# Patient Record
Sex: Male | Born: 1950 | Race: White | Smoking: Light tobacco smoker
Health system: Northeastern US, Community
[De-identification: ages and names within clinical notes are randomized; demographics above are authoritative.]

## PROBLEM LIST (undated history)

## (undated) DIAGNOSIS — K819 Cholecystitis, unspecified: Secondary | ICD-10-CM

## (undated) DIAGNOSIS — C189 Malignant neoplasm of colon, unspecified: Secondary | ICD-10-CM

## (undated) DIAGNOSIS — K37 Unspecified appendicitis: Secondary | ICD-10-CM

## (undated) HISTORY — PX: COLOSTOMY: SHX63

---

## 2006-10-02 ENCOUNTER — Emergency Department (HOSPITAL_BASED_OUTPATIENT_CLINIC_OR_DEPARTMENT_OTHER): Payer: Self-pay

## 2006-10-04 LAB — EMERGENCY ROOM NOTE

## 2006-10-06 LAB — WOUND CULTURE AND GRAM STAIN
GRAM STAIN: NONE SEEN
WOUND CULTURE: NO GROWTH

## 2017-05-19 ENCOUNTER — Encounter (HOSPITAL_BASED_OUTPATIENT_CLINIC_OR_DEPARTMENT_OTHER): Payer: Self-pay

## 2017-05-19 ENCOUNTER — Emergency Department (HOSPITAL_BASED_OUTPATIENT_CLINIC_OR_DEPARTMENT_OTHER)
Admission: RE | Admit: 2017-05-19 | Disposition: A | Discharge status: Home / Self Care | Payer: Self-pay | Source: Emergency Department | Attending: Emergency Medicine | Admitting: Emergency Medicine

## 2017-05-19 HISTORY — DX: Cholecystitis, unspecified: K81.9

## 2017-05-19 HISTORY — DX: Malignant neoplasm of colon, unspecified: C18.9

## 2017-05-19 HISTORY — DX: Unspecified appendicitis: K37

## 2017-05-19 LAB — CBC, PLATELET & DIFFERENTIAL
ABSOLUTE BASO COUNT: 0 10*3/uL (ref 0.0–0.1)
ABSOLUTE EOSINOPHIL COUNT: 0.2 10*3/uL (ref 0.0–0.8)
ABSOLUTE IMM GRAN COUNT: 0.03 10*3/uL (ref 0.00–0.03)
ABSOLUTE LYMPH COUNT: 1.1 10*3/uL (ref 0.6–5.9)
ABSOLUTE MONO COUNT: 0.7 10*3/uL (ref 0.2–1.4)
ABSOLUTE NEUTROPHIL COUNT: 8.4 10*3/uL — ABNORMAL HIGH (ref 1.6–8.3)
BASOPHIL %: 0.3 % (ref 0.0–1.2)
EOSINOPHIL %: 1.7 % (ref 0.0–7.0)
HEMATOCRIT: 50.9 % (ref 40.1–51.0)
HEMOGLOBIN: 16.5 g/dL (ref 13.7–17.5)
IMMATURE GRANULOCYTE %: 0.3 % (ref 0.0–0.4)
LYMPHOCYTE %: 10.8 % — ABNORMAL LOW (ref 15.0–54.0)
MEAN CORP HGB CONC: 32.4 g/dL (ref 31.0–37.0)
MEAN CORPUSCULAR HGB: 30.7 pg (ref 26.0–34.0)
MEAN CORPUSCULAR VOL: 94.6 fL (ref 80.0–100.0)
MEAN PLATELET VOLUME: 9.4 fL (ref 8.7–12.5)
MONOCYTE %: 7 % (ref 4.0–13.0)
NEUTROPHIL %: 79.9 % — ABNORMAL HIGH (ref 40.0–75.0)
PLATELET COUNT: 235 10*3/uL (ref 150–400)
RBC DISTRIBUTION WIDTH STD DEV: 43.9 fL (ref 35.1–46.3)
RBC DISTRIBUTION WIDTH: 12.9 % (ref 11.5–14.3)
RED BLOOD CELL COUNT: 5.38 M/uL (ref 4.60–6.10)
WHITE BLOOD CELL COUNT: 10.5 10*3/uL (ref 4.0–11.0)

## 2017-05-19 LAB — CT ABDOMEN & PELVIS W IV CONTRAST

## 2017-05-19 LAB — HEPATIC FUNCTION PANEL
ALANINE AMINOTRANSFERASE: 28 U/L (ref 12–45)
ALBUMIN: 4 g/dL (ref 3.4–5.0)
ALKALINE PHOSPHATASE: 71 U/L (ref 45–117)
ASPARTATE AMINOTRANSFERASE: 20 U/L (ref 8–34)
BILIRUBIN DIRECT: 0.1 mg/dl (ref 0.0–0.2)
BILIRUBIN TOTAL: 0.4 mg/dL (ref 0.2–1.0)
INDIRECT BILIRUBIN: 0.3 mg/dL (ref 0.2–0.9)
TOTAL PROTEIN: 7.6 g/dL (ref 6.4–8.2)

## 2017-05-19 LAB — BASIC METABOLIC PANEL
ANION GAP: 7 mmol/L (ref 5–15)
BUN (UREA NITROGEN): 15 mg/dL (ref 7–18)
CALCIUM: 9.4 mg/dL (ref 8.5–10.1)
CARBON DIOXIDE: 32 mmol/L (ref 21–32)
CHLORIDE: 104 mmol/L (ref 98–107)
CREATININE: 1.1 mg/dL (ref 0.7–1.2)
ESTIMATED GLOMERULAR FILT RATE: >60 mL/min (ref >60)
Glucose Random: 97 mg/dL (ref 74–160)
POTASSIUM: 4.8 mmol/L (ref 3.5–5.1)
SODIUM: 143 mmol/L (ref 136–145)

## 2017-05-19 LAB — HOLD GREEN TOP TUBE

## 2017-05-19 LAB — MAGNESIUM: MAGNESIUM: 2.2 mg/dL (ref 1.8–2.4)

## 2017-05-19 LAB — LIPASE: LIPASE: 110 U/L (ref 73–393)

## 2017-05-19 LAB — LACTIC ACID: LACTIC ACID: 1.7 mmol/L (ref 0.4–2.0)

## 2017-05-19 MED ORDER — MORPHINE SULFATE 4 MG/ML IV SOLN (SUPER ERX)
4.00 mg | Freq: Once | Status: AC
Start: 2017-05-19 — End: 2017-05-19
  Administered 2017-05-19: 4 mg via INTRAVENOUS
  Filled 2017-05-19: qty 1

## 2017-05-19 MED ORDER — POLYETHYLENE GLYCOL 3350 17 G PO PACK
17.00 g | PACK | Freq: Every day | ORAL | 0 refills | Status: AC
Start: 2017-05-19 — End: 2017-05-26

## 2017-05-19 MED ORDER — DIAZEPAM 5 MG PO TABS
5.00 mg | ORAL_TABLET | Freq: Three times a day (TID) | ORAL | 0 refills | Status: AC
Start: 2017-05-19 — End: 2017-05-23

## 2017-05-19 MED ORDER — CITRATE OF MAGNESIA PO SOLN
296.00 mL | Freq: Once | ORAL | Status: AC
Start: 2017-05-19 — End: 2017-05-19
  Administered 2017-05-19: 296 mL via ORAL
  Filled 2017-05-19: qty 300

## 2017-05-19 MED ORDER — POLYETHYLENE GLYCOL 3350 PO PACK: 17 g | Package | Freq: Every day | ORAL | 0 refills | 0 days | Status: AC

## 2017-05-19 MED ORDER — DIAZEPAM 5 MG PO TABS
5.00 mg | ORAL_TABLET | Freq: Once | ORAL | Status: AC
Start: 2017-05-19 — End: 2017-05-19
  Administered 2017-05-19: 5 mg via ORAL
  Filled 2017-05-19: qty 1

## 2017-05-19 MED ORDER — DIAZEPAM 5 MG PO TABS: 5 mg | tablet | Freq: Three times a day (TID) | ORAL | 0 refills | 0 days | Status: AC

## 2017-05-19 MED ORDER — NORMAL SALINE FLUSH 0.9 % IV SOLN
60.0000 mL | Freq: Once | INTRAVENOUS | Status: DC
Start: 2017-05-19 — End: 2017-05-19

## 2017-05-19 MED ORDER — IOHEXOL 240 MG/ML IJ SOLN
525.0000 mL | INTRAMUSCULAR | Status: DC | PRN
Start: 2017-05-19 — End: 2017-05-19
  Administered 2017-05-19: 525 mL via ORAL

## 2017-05-19 MED ORDER — IOHEXOL 300 MG/ML IJ SOLN
100.00 mL | Freq: Once | INTRAMUSCULAR | Status: AC
Start: 2017-05-19 — End: 2017-05-19
  Administered 2017-05-19: 100 mL via INTRAVENOUS

## 2017-05-19 MED ORDER — SODIUM CHLORIDE 0.9 % IV BOLUS
1000.0000 mL | Freq: Once | INTRAVENOUS | Status: AC
Start: 2017-05-19 — End: 2017-05-19
  Administered 2017-05-19: 1000 mL via INTRAVENOUS

## 2017-05-19 NOTE — Narrator Note (Signed)
Patient reports pain has improved slightly. Rated 8 out of 10. Transported to CT at this time

## 2017-05-19 NOTE — Narrator Note (Signed)
Patient Disposition    Patient education for diagnosis, medications, activity, diet and follow-up.  Patient left ED 1:34 PM.  Patient rep received written instructions.  Interpreter to provide instructions: No    Patient belongings with patient: YES    Have all existing LDAs been addressed? Yes    Have all IV infusions been stopped? Yes    Discharged to: Discharged to home. Pt given RX for miralax and valium. D/C and follow up instructions reviewed with pt. Pt verbalized understanding of instructions and ambulated to waiting room. Pt educated of side effects of valium and morphine. Pt states his wife will be picking him up.

## 2017-05-19 NOTE — Narrator Note (Signed)
MD at bedside. Stool sample solid unable to run C.diff exam however stool was solid and did not have odor . Likely not c.diff MD aware

## 2017-05-19 NOTE — Narrator Note (Signed)
Pt experiencing some abd cramping. Thinks trying to defecating will relieve the pain. Ambulatory to restroom. Declined assistance

## 2017-05-19 NOTE — ED Triage Note (Signed)
Self presents ambulatory with wife. Reports abd pain x 5 weeks with loose stools and cramping. Reports significant abdominal illness including colon cancer, temporary colostomy, radiation, chemo, appendicitis, cholecystitis. Reports cancer was treated from 200 to 2003 and was followed at [facility]. However pt moved [place], just returned to [place] 2 months ago. Reports taking bentyl for the cramping, this helps. And took 2 tabs of percocet last night, this helped too. Reports "I quit smoking two weeks ago and thought I was having withdrawals"

## 2017-05-19 NOTE — Narrator Note (Signed)
MD at bedside for reeval

## 2017-05-19 NOTE — Narrator Note (Signed)
Pt requesting additional pain medicine.

## 2017-05-19 NOTE — Narrator Note (Signed)
MD at bedside conducting assessment

## 2017-05-20 NOTE — ED Provider Notes (Addendum)
The patient was seen primarily by me. ED nursing record was reviewed. Prior records as available electronically through the Epic record were reviewed.         HPI:    This is a 66 year old male patient complaining of acute on chronic abdominal pain.  Patient reports 5 days of worsening abdominal pain, crampy in nature, associated with several small bowel movements, none liquid.  Wife and himself were caring for his wife's father recently was diagnosed with C. difficile, concerned he may have been infected.  Patient denies any fevers, nausea, vomiting, diarrhea, dizziness, syncope, dysuria, hematuria, testicle pain or swelling, penile pain, penile discharge, rash.  Patient with complex abdominal history including colon cancer status post resection, colostomy with reanastomosis.  Patient reports he took a Percocet yesterday to help with pain.  Otherwise has been using Bentyl with mild relief.  Patient does admit to chronic use of Imodium to counteract chronic intermittent diarrhea.  Denies large volume of stools, denies significant liquid stool.  Patient reports colon cancer in remission since 2003.      ROS: Pertinent positives were reviewed as per the HPI above. All other systems were reviewed and are negative.      Past Medical History/Problem list:  Past Medical History:   Diagnosis Date    Appendicitis     Cholecystitis     Colon cancer ([place])      There is no problem list on file for this patient.        Past Surgical History: Past Surgical History:  No date: COLOSTOMY      Medications:   No current facility-administered medications for this encounter.   Current Outpatient Medications:  oxyCODONE-acetaminophen (PERCOCET) 5-325 MG per tablet Take 1 tablet by mouth every 4 (four) hours as needed for Pain Disp:  Rfl:    dicyclomine (BENTYL) 10 MG capsule Take 10 mg by mouth 4 (four) times daily before meals and nightly Disp:  Rfl:    polyethylene glycol (GLYCOLAX/MIRALAX) packet Take 1 packet by mouth daily for 7  days Disp: 30 Package Rfl: 0   diazepam (VALIUM) 5 MG tablet Take 1 tablet by mouth every 8 (eight) hours for 4 days Disp: 12 tablet Rfl: 0         Social History: Social History    Tobacco Use      Smoking status: Light Tobacco Smoker      Tobacco comment: quit cigarettes 2 weeks ago    Alcohol use: Yes      Comment: socially        Allergies:  Review of Patient's Allergies indicates:   Ampicillin              Other (See Comments)    Comment:As a child   Erythromycin            Other (See Comments)    Comment:As a child      Physical Exam:  ED Triage Vitals   ED Triage Vitals Brief Group      Temp 05/19/17 0842 98.1 F      Pulse 05/19/17 0842 71      Resp 05/19/17 0842 20      BP 05/19/17 0842 126/75      SpO2 05/19/17 0842 98 %      Pain Score 05/19/17 1121 8        GENERAL: No acute distress.   SKIN:  Warm & Dry, no rash.  HEAD: Atraumatic. PERRL. EOMI.  Oropharynx: clear.  NECK: No midline tenderness.  No LAN.   LUNGS:  Clear to auscultation bilaterally. No wheezes, rales, rhonchi.   HEART:  RRR.  No murmurs, rubs, or gallops.   ABDOMEN:  Soft, moderate left lower quadrant tenderness to palpation.  No guarding or rebound tenderness.   MUSCULOSKELETAL:  No obvious deformities.    NEUROLOGIC: Alert and oriented.  Moves all extremities well.  PSYCHIATRIC:  Appropriate for age, time of day, and situation        ED Course and Medical Decision-making:    The patient is a  66 year old male with past medical history as above presents with abdominal pain, described as spasms for 5 days.  Patient with small bowel movements several times a day, description does not appear consistent with C. difficile however, if patient provide sample, we will send.  Few episodes of spasm witnessed in ED, suspect related to intestinal peristalsis, probable constipation versus partial obstruction.  Given patient's complex abdominal history, CT performed to evaluate for obstruction, colitis, diverticulitis.  Patient given Valium for  spasmodic pain.  Patient with mild relief however, reporting pain is increasing.  Patient given morphine for further pain control while awaiting CT.  Labs unremarkable.  CT significant for large volume of stool.  Patient given magnesium citrate to facilitate larger bowel movement.  Patient encouraged avoidance of Imodium and opioids as these are likely adversely affecting gut motility and causing patient's current state.  Patient given Valium for further pain control until stool clears, MiraLAX until patient is having regular soft stools.  Dietary modification discussed, patient agreeable with plan.  Patient with plan follow-up with PCP and gastroenterologist.    Additional verbal discharge instructions were provided including patients diagnosis and follow up plan, as well as reasons to return to the Emergency Department which were discussed in detail.  Patient is agreeable with this management.         Condition: Improved and Stable    Disposition:  Discharged to home    Diagnosis/Diagnoses:  Generalized abdominal pain  Chronic idiopathic constipation        Ivy Lynn  Attending Physician  [facility]  [phone]

## 2021-03-28 IMAGING — MR MRI LUMBAR SPINE WITHOUT CONTRAST
4 of 7 series · 19 of 48 positions shown · IV contrast (gadolinium)
Comparison: None

________________________________________________________________________________________________ 
MRI LUMBAR SPINE WITHOUT CONTRAST, 03/28/2021 [DATE]: 
CLINICAL INDICATION: History of rectal cancer, low back pain
TECHNIQUE: Multiplanar, multiecho position MR images of the lumbar spine were 
performed without intravenous gadolinium enhancement. Patient was scanned on a 
1.5T magnet.

[Series 101: survey · axial · 10.0mm · 1.39mm/px · z∈[-33,+201]mm · 2 of 10 slices shown]
[im 1/10]
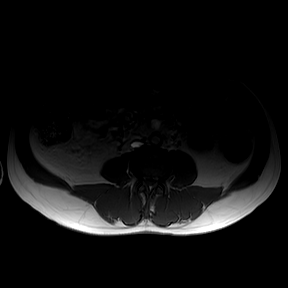
[im 10/10]
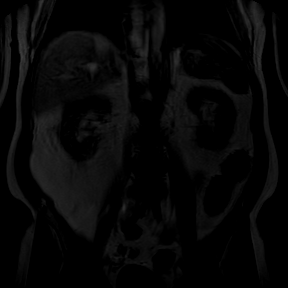

[Series 201: t2w_cor-surv · coronal · 6.0mm · 0.60mm/px · 3 of 8 slices shown]
[im 1/8]
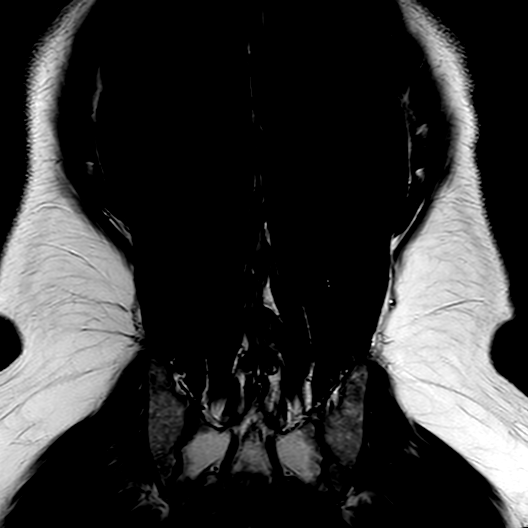
[im 4/8]
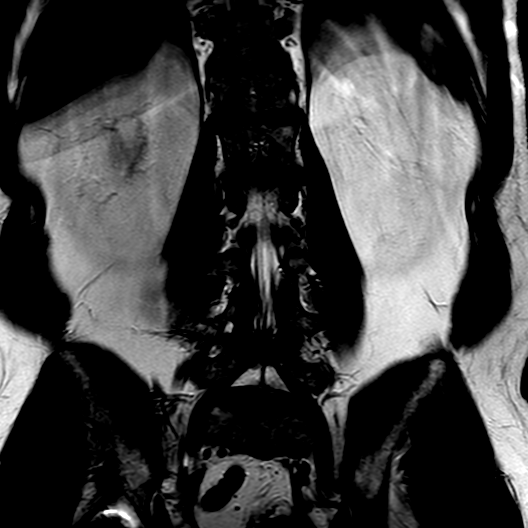
[im 8/8]
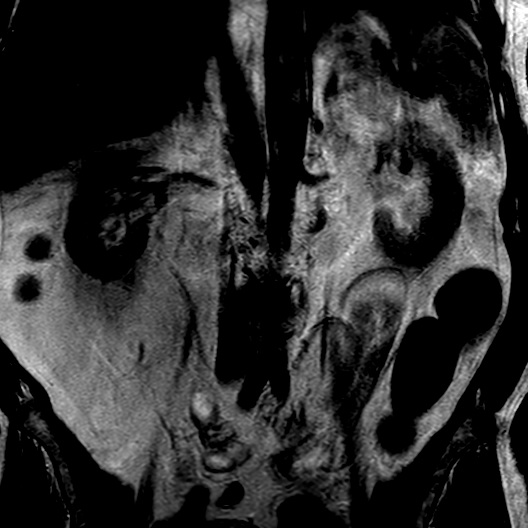

[Series 301: t1_tse_sag · sagittal · 4.0mm · 0.48mm/px · 5 of 19 slices shown]
[im 1/19]
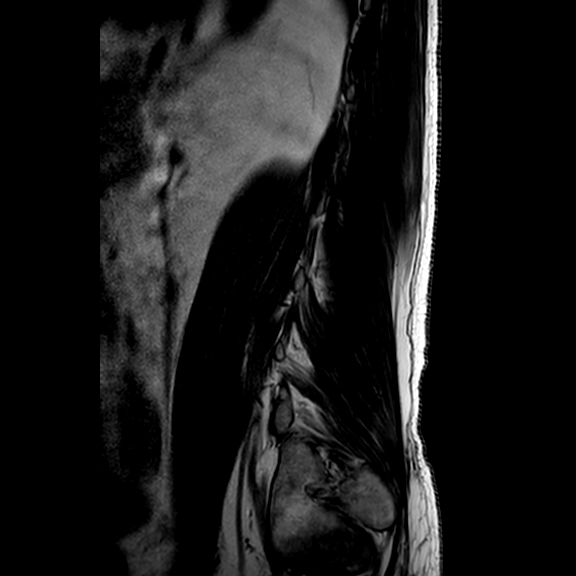
[im 4/19]
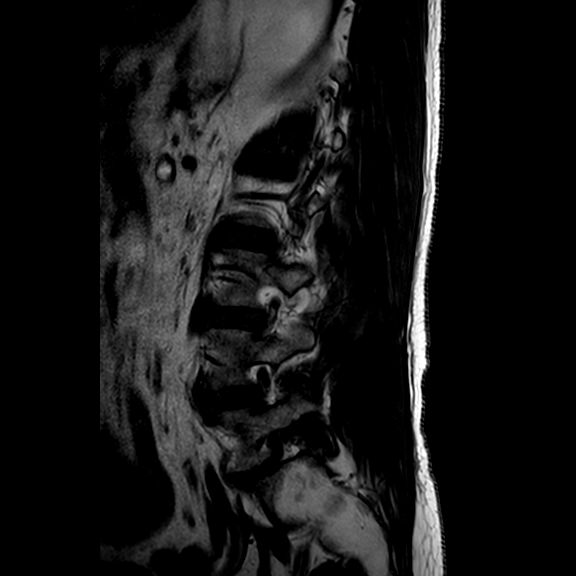
[im 7/19]
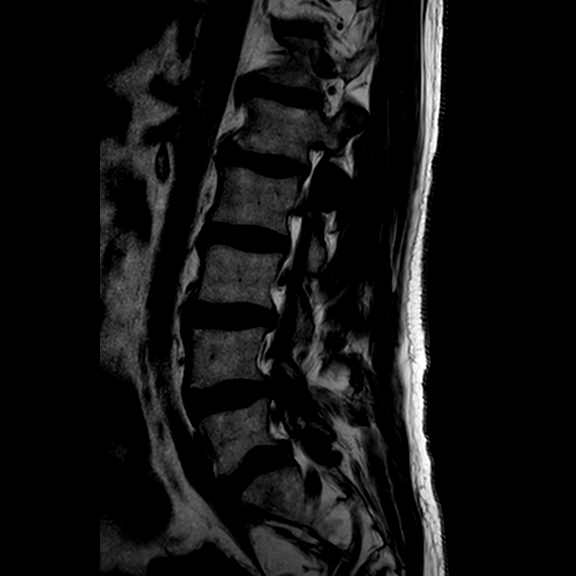
[im 10/19]
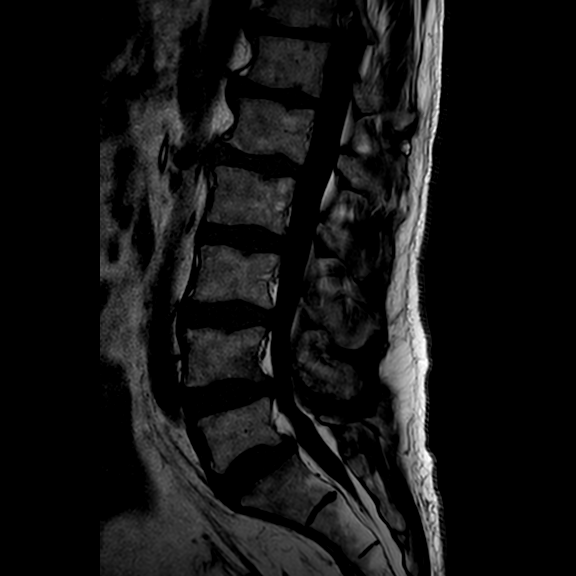
[im 16/19]
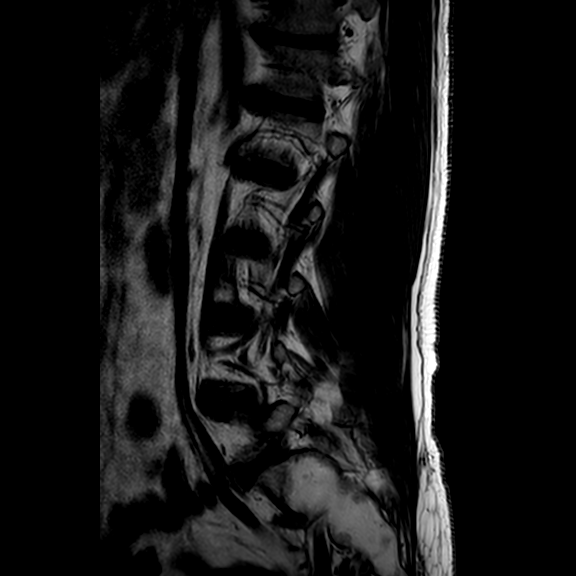

[Series 701: T1 · axial · 4.0mm · 0.38mm/px · z∈[-129,+75]mm · 9 of 35 slices shown]
[im 1/35]
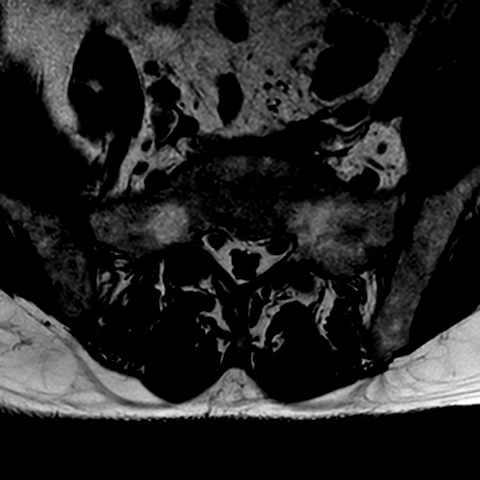
[im 7/35]
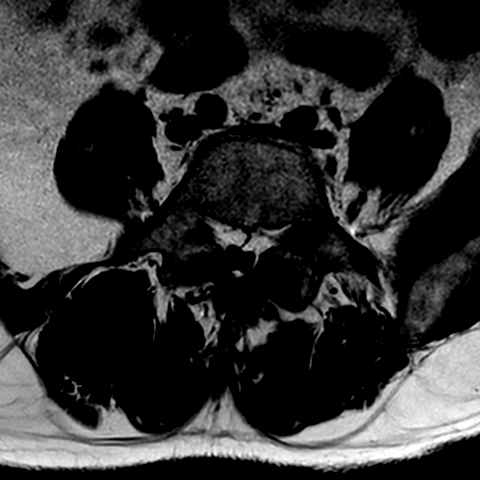
[im 10/35]
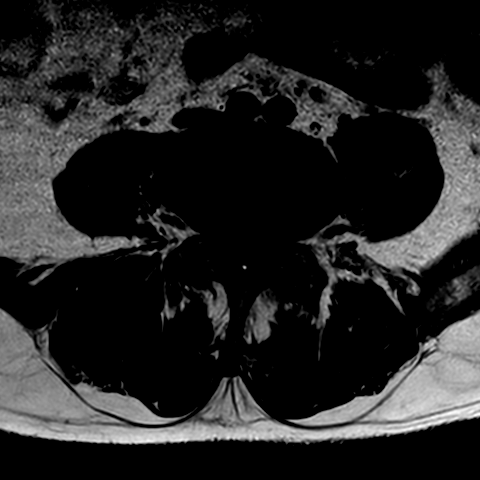
[im 16/35]
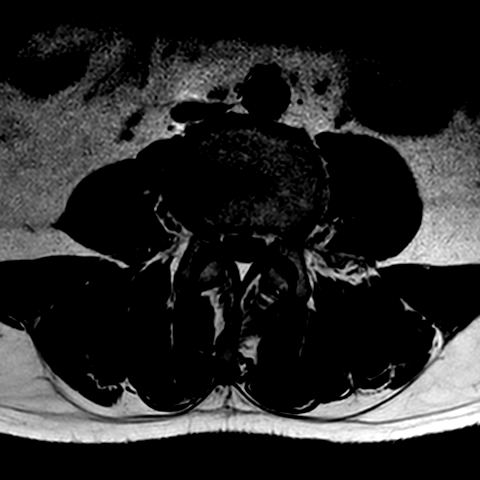
[im 19/35]
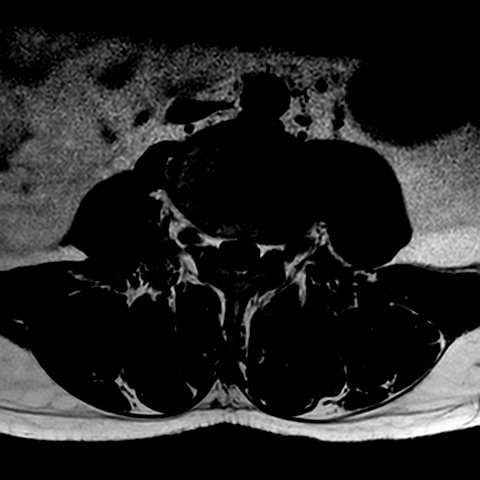
[im 25/35]
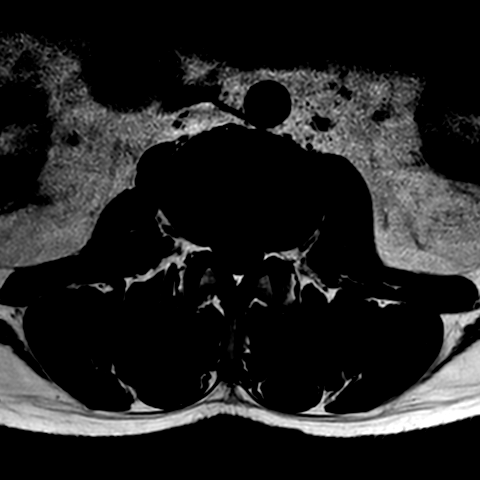
[im 28/35]
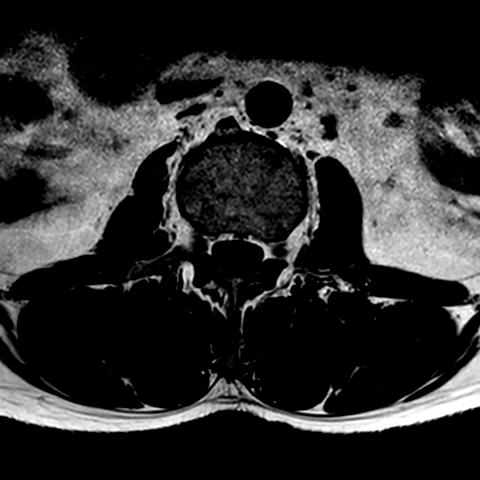
[im 31/35]
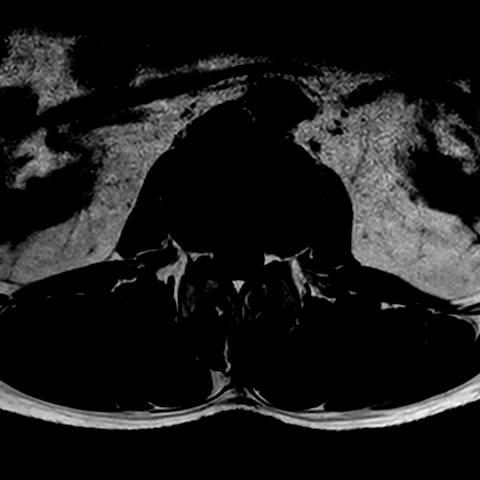
[im 35/35]
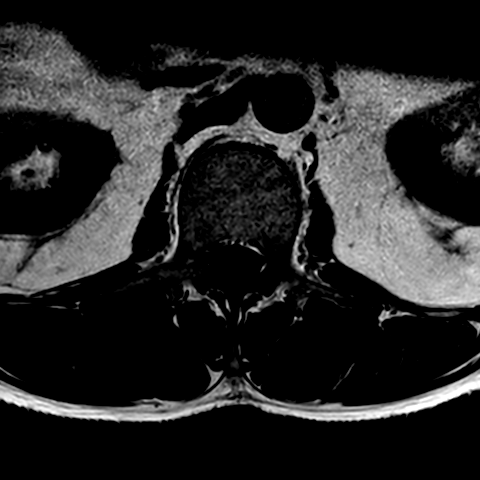

[19 of 48 positions shown; findings below may reference images not displayed]

FINDINGS: Lumbar vertebral heights are intact. There is no listhesis. No 
fracture, malignancy or Modic type I changes. 
At L5-S1 there is 2 mm anterolisthesis. Bilateral pars defects. There is no 
significant canal stenosis. There is mild bilateral foraminal stenosis. Mild to 
moderate facet degenerative change. 
At L4-5 there is broad-based disc bulge and moderate facet change with 
ligamentous thickening. Moderate to marked canal stenosis, axial image 8. Mild 
bilateral foraminal stenosis. 
At L3-4 there is mild disc bulge and mild to moderate facet change with 
ligamentous thickening contributing to mild to moderate canal stenosis. There is 
mild bilateral foraminal narrowing. 
At L2-3 and L1-2 the canal and foramina are open. 
Conus terminates opposite T12-L1. There are small chronic Schmorls nodes from 
T12 through L3. There is mild lumbar levoscoliosis. There is a right hip 
prosthesis.
IMPRESSION: Degenerative changes. There is moderate to marked canal stenosis at L4-5. 
2 mm anterolisthesis of L5 on S1 with bilateral pars defects. 
No evidence for compression fracture or malignancy. There are small chronic 
Schmorls nodes. 
Mild lumbar levoscoliosis. 
Right hip prosthesis. 
Abdominal aorta shows normal caliber. Visualized psoas and paraspinal 
musculature are symmetric, without evidence for abnormality.

## 2021-07-10 IMAGING — CT CT LUMBAR SPINE WITHOUT CONTRAST
2 series · 14 of 20 positions shown, 17 images · non-contrast
Comparison: Lumbar MRI March 28, 2021

________________________________________________________________________________________________ 
CT LUMBAR SPINE WITHOUT CONTRAST, 07/10/2021 [DATE]: 
CLINICAL INDICATION: History of rectal cancer with colon resection. Stenosis 
L4-5, spondylolisthesis L5-S1. 
A search for DICOM formatted images was conducted for prior CT imaging studies 
completed at a non-affiliated media free facility.
TECHNIQUE: The lumbar spine was scanned from T12 through mid-sacrum without 
contrast on a high-resolution CT scanner using dose reduction techniques. 
Routing MPR reconstructions were performed. 3-D renderings were reconstructed on 
an independent workstation with concurrent physician supervision.

[Series 2: axial · axial · 0.45mm/px · z∈[+695,+942]mm · 11 of 293 slices shown, 14 images]
[im 23/293  soft-tissue]
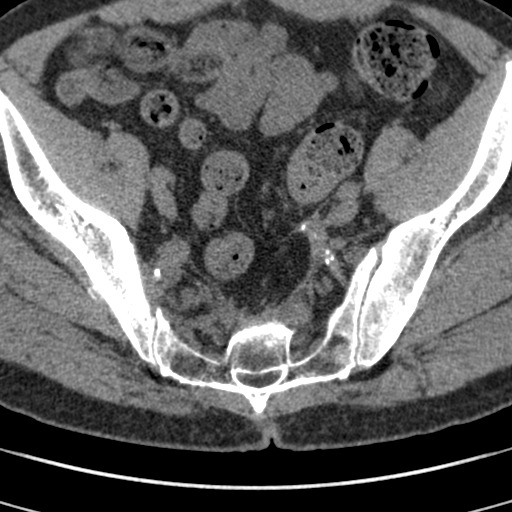
[im 23/293  bone]
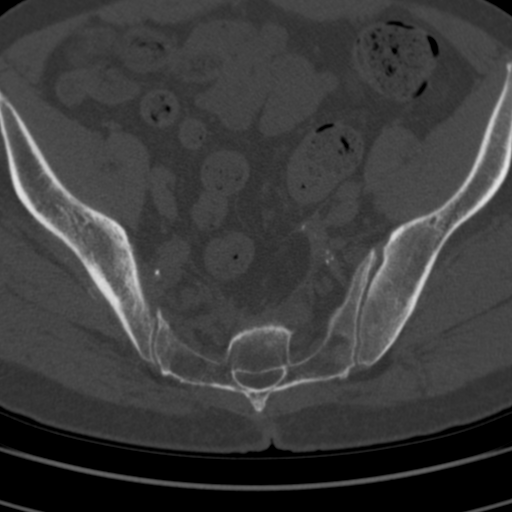
[im 45/293  bone]
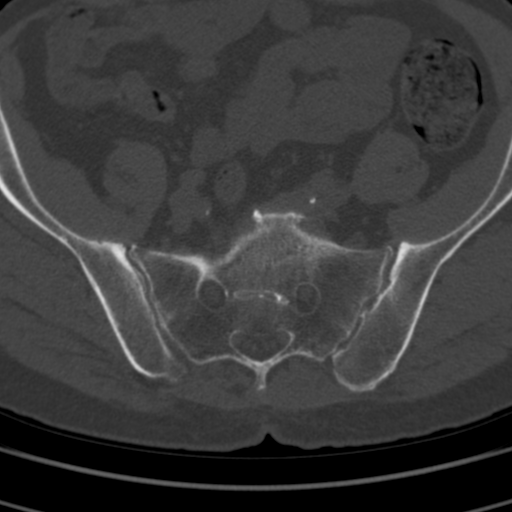
[im 68/293  bone]
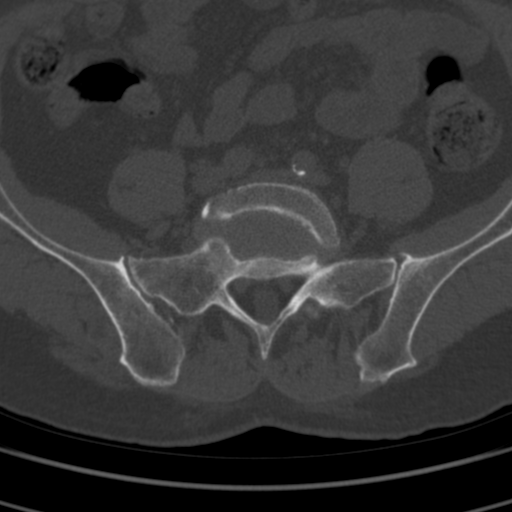
[im 90/293  bone]
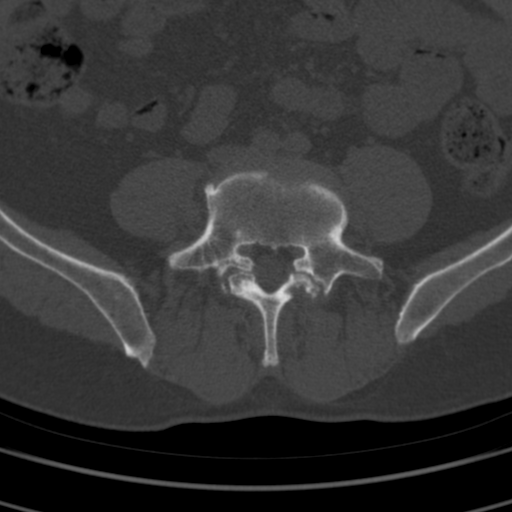
[im 113/293  soft-tissue]
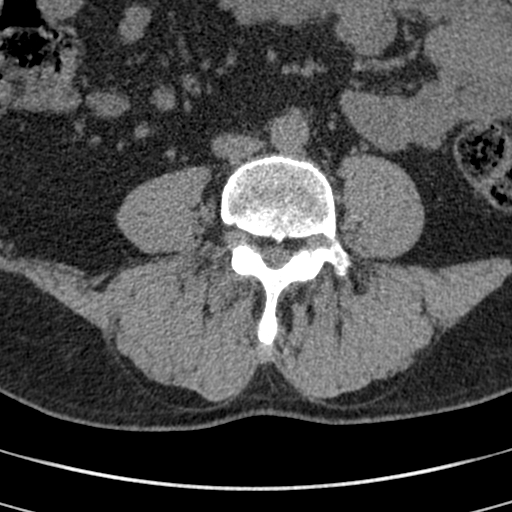
[im 113/293  bone]
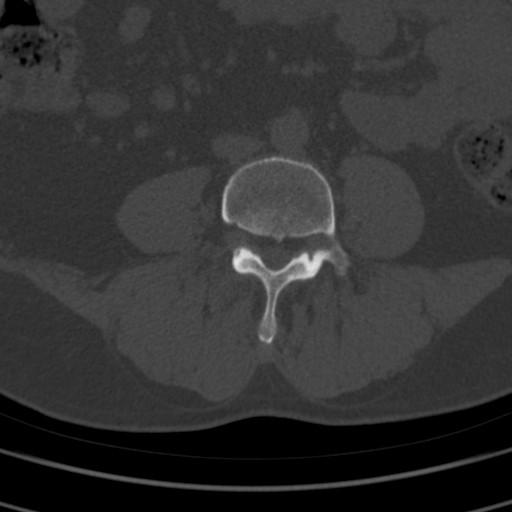
[im 158/293  bone]
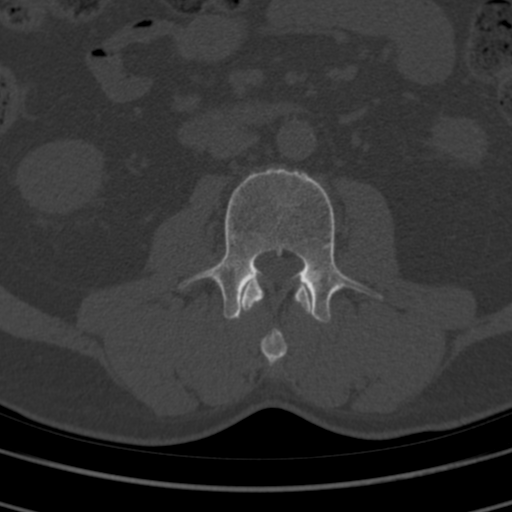
[im 180/293  bone]
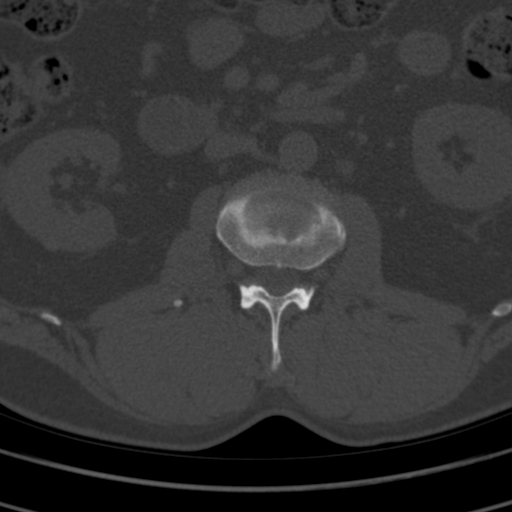
[im 203/293  bone]
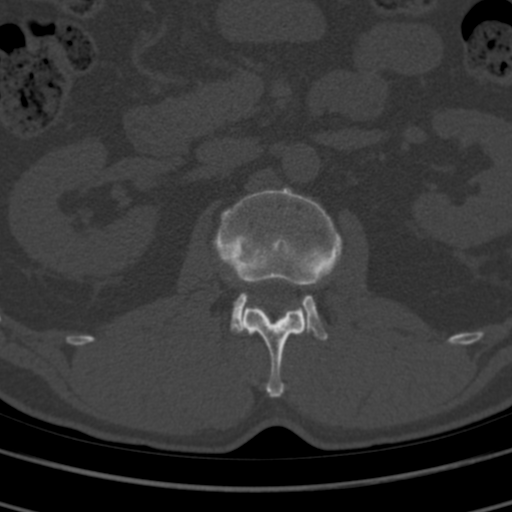
[im 225/293  soft-tissue]
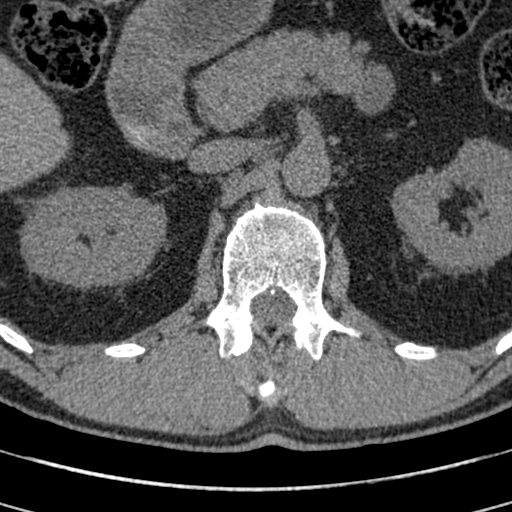
[im 225/293  bone]
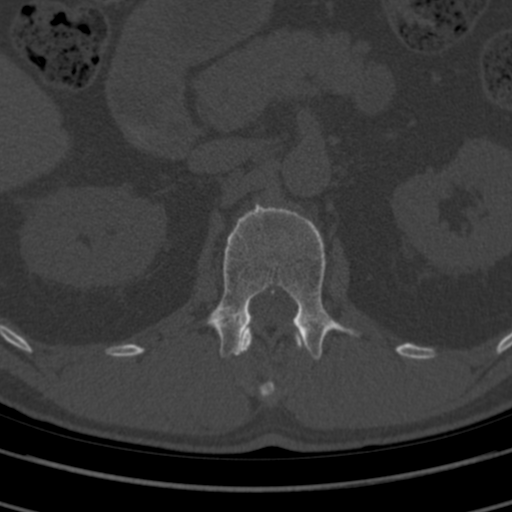
[im 248/293  bone]
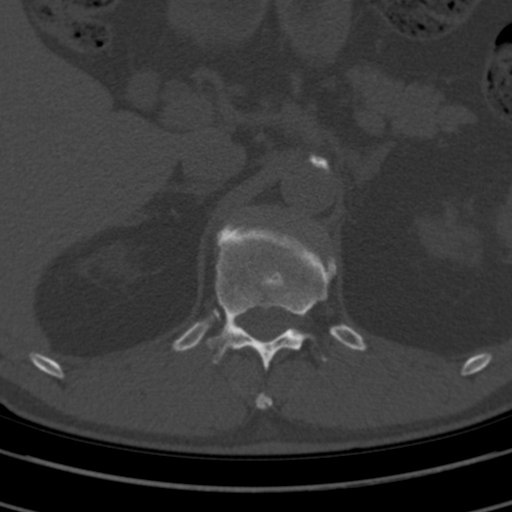
[im 270/293  bone]
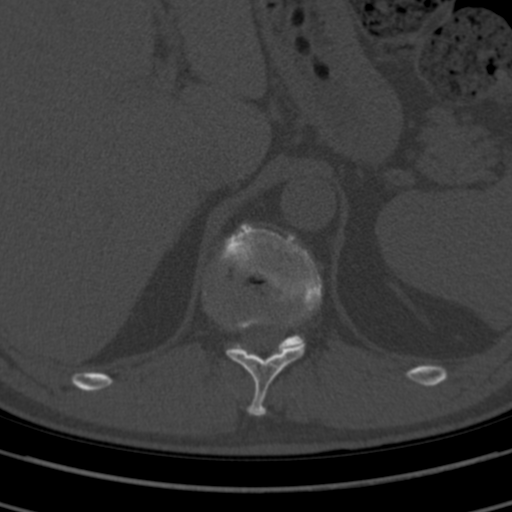

[Series 4: cor · coronal · 0.44mm/px · 3 of 149 slices shown]
[im 30/149  bone]
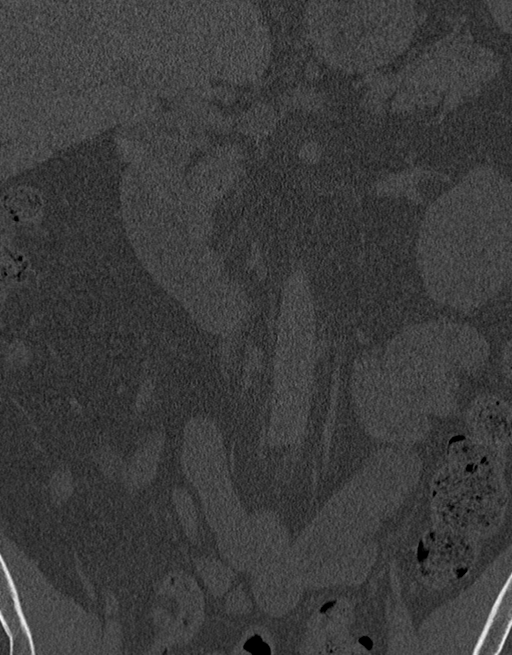
[im 60/149  bone]
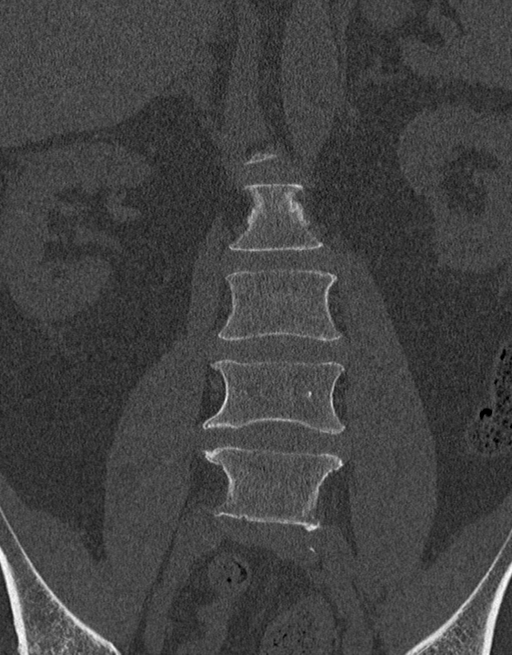
[im 89/149  bone]
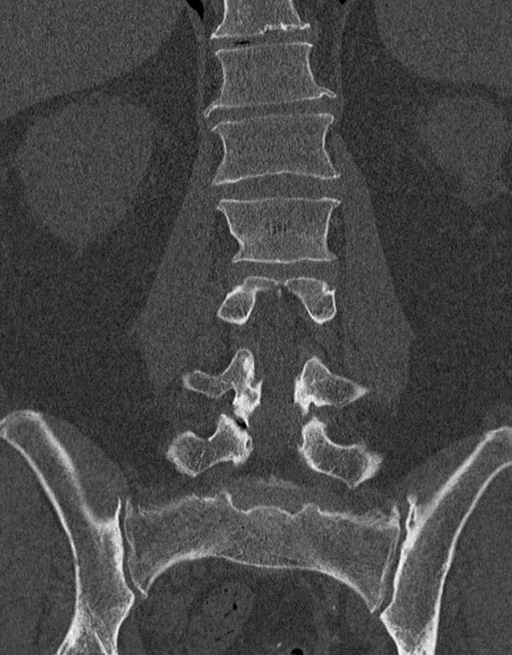

[14 of 20 positions shown; findings below may reference images not displayed]

FINDINGS: Lumbar vertebral heights are intact. Prior MRI showed slight 
anterolisthesis at L5-S1, similar to the CT appearance, measuring approximately 
2 mm. There are bilateral L5 pars defects. 
Lumbar disc heights are preserved. There is marked disc narrowing at T11-12. 
At L5-S1 the canal is open. There is moderate to marked bilateral foraminal 
stenosis deforming the distal L5 nerve roots, appearing worse on CT than on the 
prior MRI. 
At L4-5 there is moderate to marked canal stenosis, axial image 191, not 
significantly different compared with the prior MRI. There is moderate bilateral 
foraminal stenosis. 
At L3-4 there is moderate canal stenosis appearing worse, axial image 160. There 
is mild bilateral foraminal stenosis. 
At L2-3 there is mild canal stenosis. Foramina are open. At L1-2 the canal and 
foramina are open. 
There are chronic Schmorls nodes in the thoracolumbar junction. Minimal aortic 
plaque. No aneurysm. There are mild SI joint degenerative changes. There is mild 
lumbar levocurvature.
IMPRESSION: Moderate to marked canal stenosis at L4-5, not significantly different from the 
prior MRI. 
Moderate canal stenosis at L3-4, appearing mildly worse compared to the prior 
MRI.  
Moderate to marked bilateral foraminal stenosis at L5-S1 appears worse on CT 
than MRI. There is 2 mm anterolisthesis of L5 on S1 with bilateral pars defects, 
not different from the prior MRI. 
Mild lumbar levocurvature. 
RADIATION DOSE REDUCTION: All CT scans are performed using radiation dose 
reduction techniques, when applicable.  Technical factors are evaluated and 
adjusted to ensure appropriate moderation of exposure.  Automated dose 
management technology is applied to adjust the radiation doses to minimize 
exposure while achieving diagnostic quality images.

## 2023-05-31 IMAGING — MR MRI LUMBAR SPINE W/WO CONTRAST
6 of 12 series · 10 of 48 positions shown · IV contrast (gadavist)
Comparison: Lumbar spine x-ray May 21, 2023, CT lumbar spine July 10, 2021 and MRI lumbar spine March 28, 2021.

________________________________________________________________________________________________ 
MRI LUMBAR SPINE W/WO CONTRAST, 05/31/2023 [DATE]: 
CLINICAL INDICATION: Low back pain, unspecified , bilateral radiculopathy. 
History of laminectomy. Rectal cancer. Spondylolisthesis L4-L5 and L5-S1.
TECHNIQUE: Multiplanar, multiecho position MR images of the lumbar spine were 
performed without and with 8.5 mL of Gadavist were injected intravenously by 
hand. 1.5 mL of Gadavist discarded. Patient was scanned on a 1.5T magnet

[Series 101: survey · axial · 10.0mm · 1.25mm/px · 1 of 10 slices shown]
[im 1/10]
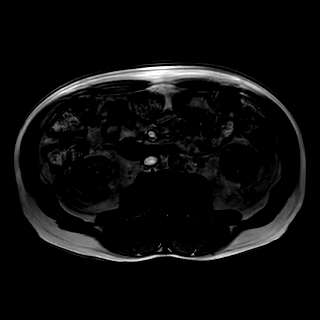

[Series 201: t2w_cor-surv · coronal · 6.0mm · 0.62mm/px · 1 of 10 slices shown]
[im 1/10]
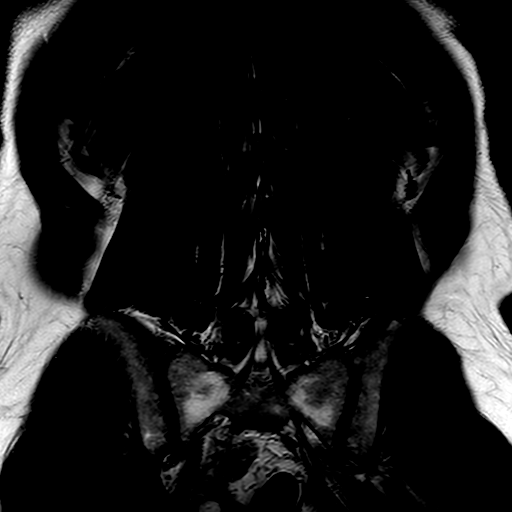

[Series 301: t1_tse_sag · sagittal · 4.0mm · 0.43mm/px · 2 of 20 slices shown]
[im 1/20]
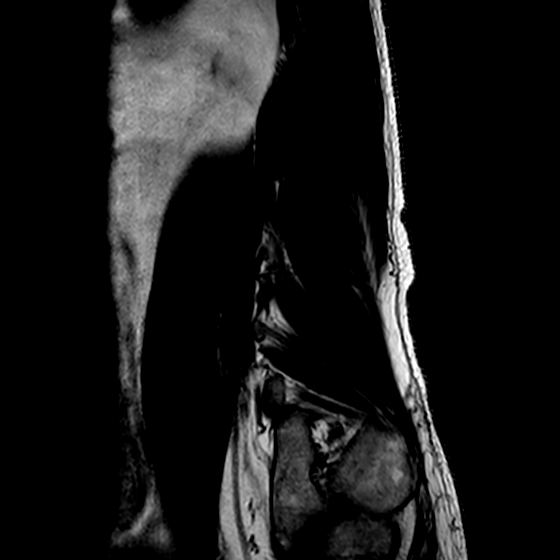
[im 20/20]
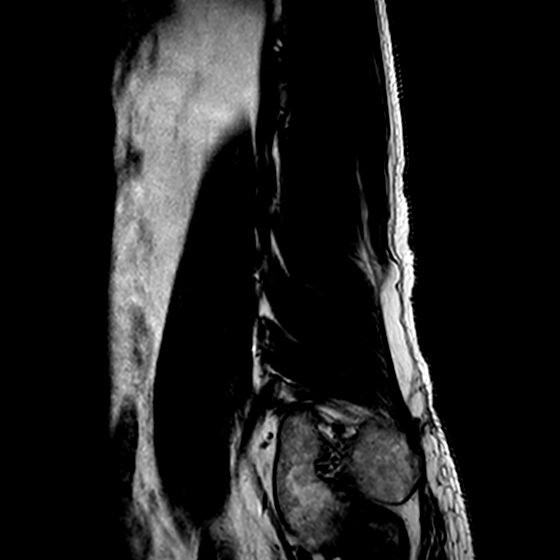

[Series 402: (id)_mdixon_tse · sagittal · 4.0mm · 0.47mm/px · 2 of 20 slices shown]
[im 1/20]
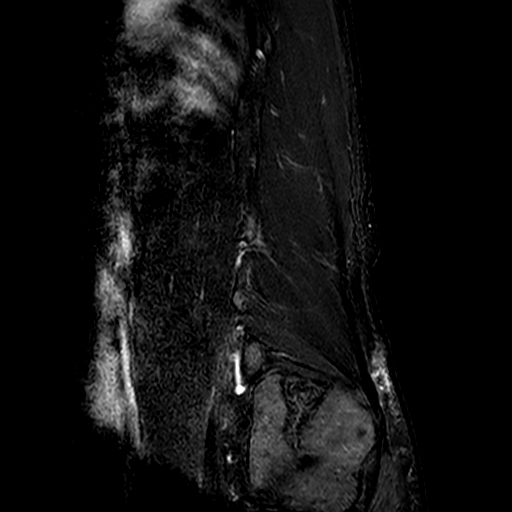
[im 20/20]
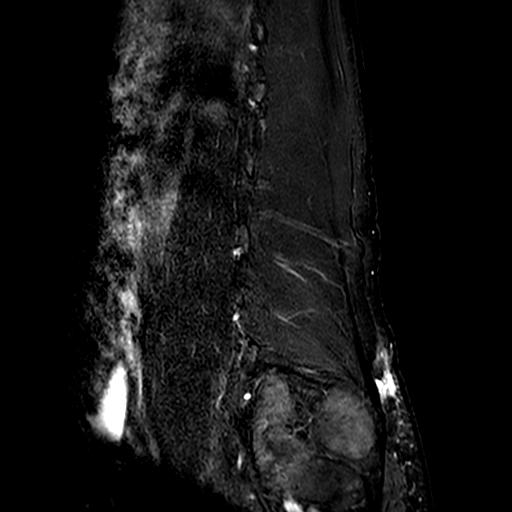

[Series 403: st2w_mdixon_tse · sagittal · 4.0mm · 0.47mm/px · 2 of 20 slices shown]
[im 1/20]
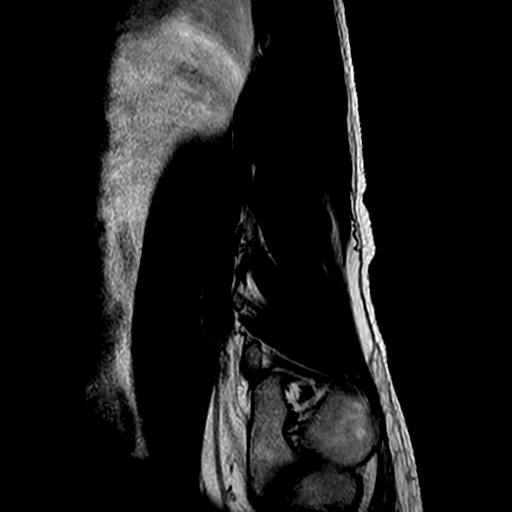
[im 20/20]
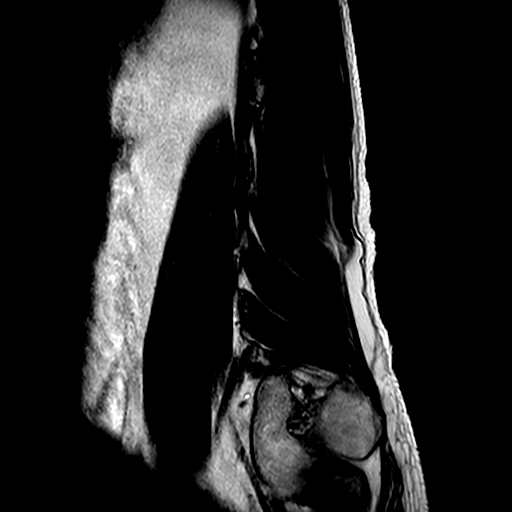

[Series 502: (id) view_ax mpr · axial · 1.0mm · 0.25mm/px · z∈[-149,-118]mm · 2 of 139 slices shown]
[im 1/139]
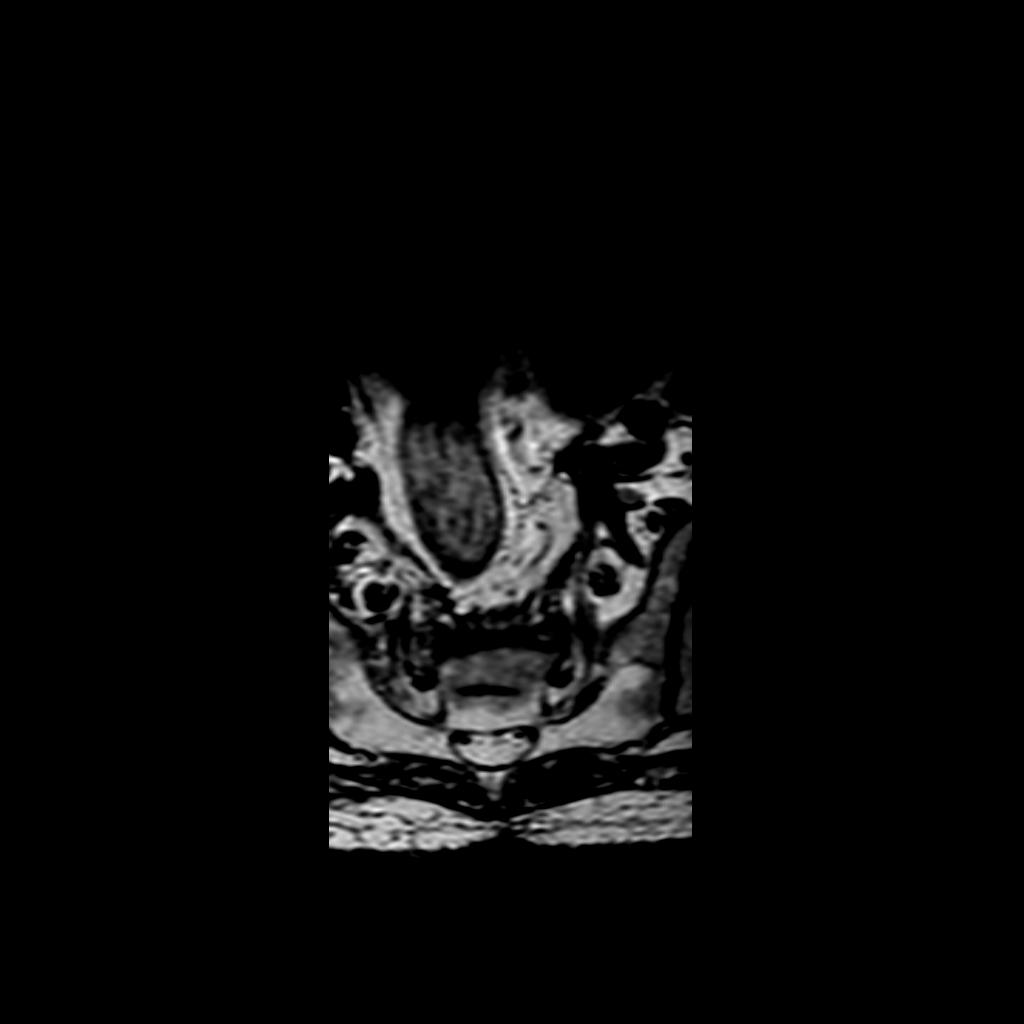
[im 22/139]
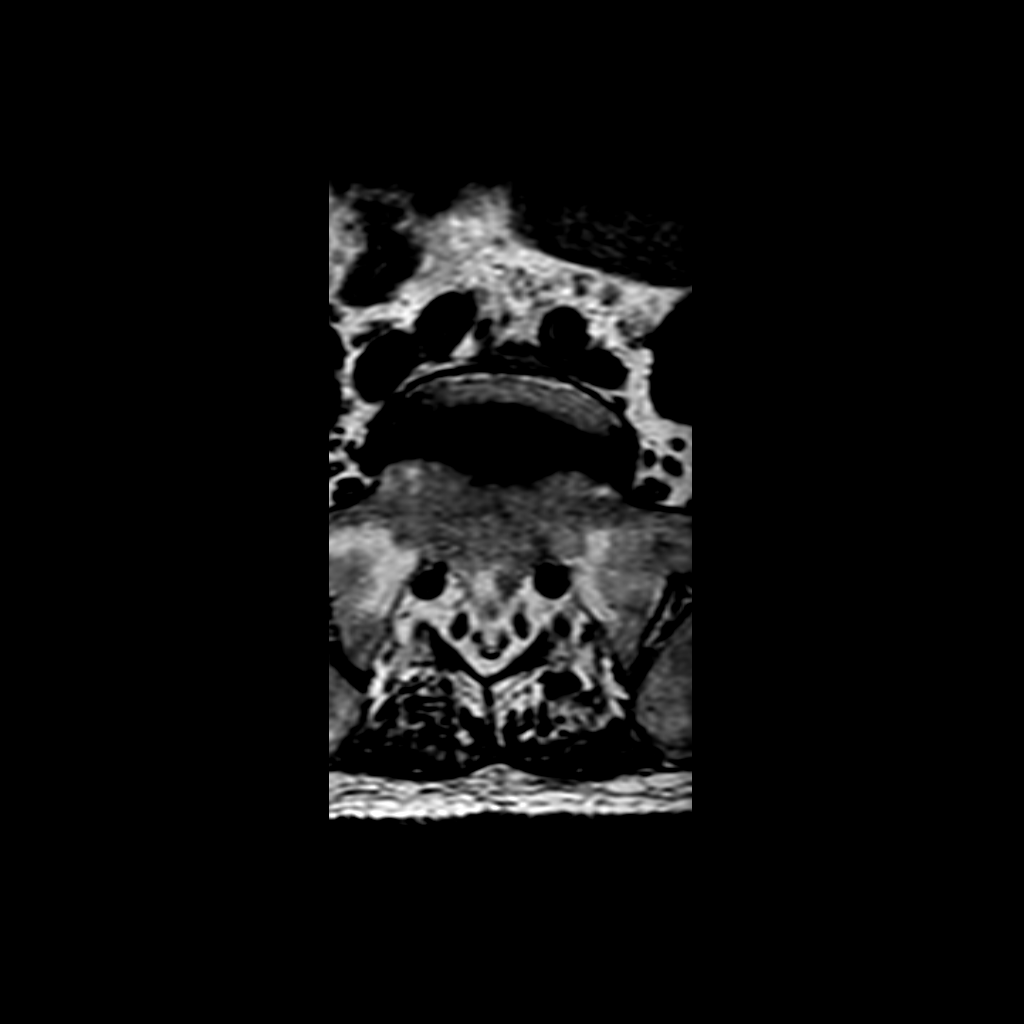

[10 of 48 positions shown; findings below may reference images not displayed]

FINDINGS: Previous laminectomy L4-L5. There is enhancement within the laminectomy bed and 
surrounding the thecal sac, likely related to granulation tissue. No evidence of 
discitis, osteomyelitis, or epidural abscess. 
-------------------------------------------------------------------------------- 
------ 
GENERAL: 
Nomenclature is based on 5 lumbar type vertebral bodies.     
ALIGNMENT: Mild smooth appearing levoconvex thoracic lumbar scoliosis. Slight 
loss of the normal lumbar lordosis with grade 1 retrolisthesis L3 on L4, grade 1 
anterolisthesis L4 on L5 and L5 on S1. Bilateral L5 spondylolysis. The 
anterolisthesis L4 on L5 has slightly increased in the interval. Otherwise 
stable alignment. 
VERTEBRAL BODY HEIGHT: Normal.  
MARROW SIGNAL: No focal suspect signal abnormality. 
CORD SIGNAL: Normal distal spinal cord and cauda equina. Conus medullaris 
terminates at L1. 
ADDITIONAL FINDINGS: Susceptibility artifact from right hip arthroplasty.. 
Modic I-II: L4-L5 
Ligamentum Flavum > 2.5 mm: All except the laminectomy site. 
-------------------------------------------------------------------------------- 
------ 
SEGMENTAL: 
T12-L1: Loss of disc height and signal. Schmorls nodes. Minimal annular bulge. 
Borderline canal stenosis. Foramina patent with normal facets. Stable. 
L1-L2: Loss of disc height and signal with Schmorls nodes. Mild canal stenosis. 
Mild facet arthropathy. Patent right foramen. Mild left foraminal narrowing. 
Stable. 
L2-L3: Slight loss of disc signal. Schmorls nodes. Borderline canal stenosis. 
Mild right lateral recess narrowing. Normal appearing facets. Foramina patent. 
Stable. 
L3-L4: Loss of disc signal. Mild canal stenosis. There is moderate lateral 
recess narrowing bilaterally. Facet arthropathy. Mild bilateral foraminal 
narrowing. Canal stenosis has slightly progressed as has lateral recess 
stenosis. Foramina are patent. 
L4-L5: Loss of disc height and signal. Annular bulge with mild canal stenosis. 
There is moderate lateral recess narrowing bilaterally. Facet diastases with 
moderate size bilateral facet joint effusions and facet arthropathy. Moderate to 
severe right and left foraminal narrowing. Enhancing annular fissure. Canal 
demonstrate better patency in the interval. Foraminal narrowing is stable. 
L5-S1: Loss of disc signal. Annular bulge. Enhancing annular fissure. Canal 
patent. Foramina are slightly elongated and mildly moderately narrowed 
bilaterally. Facet arthropathy. Stable. 
-------------------------------------------------------------------------------- 
------
IMPRESSION: Previous laminectomy L4-L5. Enhancement in the laminectomy bed and surrounding 
the thecal sac is likely related to granulation tissue. 
L3-L4, mild canal stenosis with moderate lateral recess narrowing bilaterally, 
slightly progressed in the interval. 
Multilevel other lateral recess and foraminal narrowing as detailed above, 
appears stable. 
Mild malalignment as detailed above with bilateral L5 spondylolysis.
